# Patient Record
Sex: Male | Born: 1960 | Race: Black or African American | Hispanic: No | Marital: Single | Smoking: Current every day smoker
Health system: Southern US, Community
[De-identification: ages and names within clinical notes are randomized; demographics above are authoritative.]

## PROBLEM LIST (undated history)

## (undated) DIAGNOSIS — J439 Emphysema, unspecified: Secondary | ICD-10-CM

## (undated) DIAGNOSIS — T7840XA Allergy, unspecified, initial encounter: Secondary | ICD-10-CM

## (undated) DIAGNOSIS — I10 Essential (primary) hypertension: Secondary | ICD-10-CM

## (undated) DIAGNOSIS — E119 Type 2 diabetes mellitus without complications: Secondary | ICD-10-CM

## (undated) HISTORY — DX: Emphysema, unspecified: J43.9

## (undated) HISTORY — DX: Allergy, unspecified, initial encounter: T78.40XA

## (undated) HISTORY — DX: Type 2 diabetes mellitus without complications: E11.9

## (undated) HISTORY — PX: OTHER SURGICAL HISTORY: SHX169

## (undated) HISTORY — DX: Essential (primary) hypertension: I10

---

## 2007-04-01 ENCOUNTER — Emergency Department: Payer: Self-pay | Admitting: Emergency Medicine

## 2007-05-17 ENCOUNTER — Inpatient Hospital Stay: Payer: Self-pay | Admitting: Psychiatry

## 2008-12-20 ENCOUNTER — Emergency Department: Payer: Self-pay | Admitting: Emergency Medicine

## 2010-11-10 IMAGING — CT CT ABD-PELV W/ CM
1 of 2 series · 15 of 32 positions shown, 19 images · IV contrast (isovue)
Comparison: None

REASON FOR EXAM: (1) LLQ pain; (2) LLQ pain
COMMENTS:

PROCEDURE:     CT  - CT ABDOMEN / PELVIS  W  - December 20, 2008  [DATE]
RESULT:     History: Left lower quadrant pain
TECHNIQUE: Multiple axial images of the abdomen and pelvis were performed
from the lung bases to the pubic symphysis, with p.o. contrast and with 100
ml of Isovue 370 intravenous contrast.

[Series 2: appendicitis · axial · 0.77mm/px · z∈[-494,-66]mm · 15 of 157 slices shown, 19 images]
[im 7/157  soft-tissue]
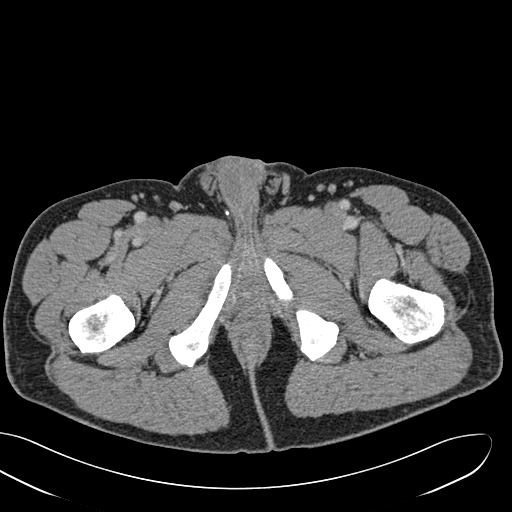
[im 7/157  bone]
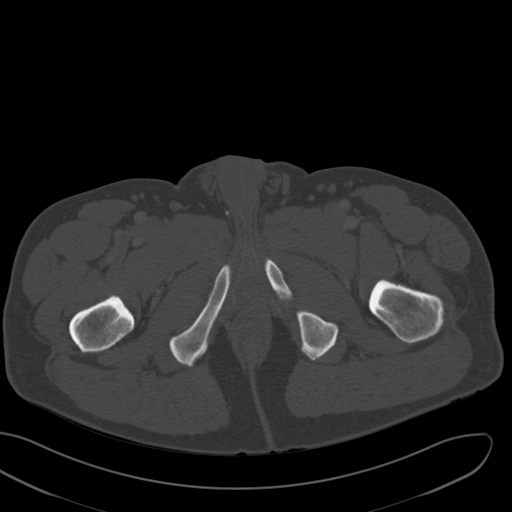
[im 20/157  soft-tissue]
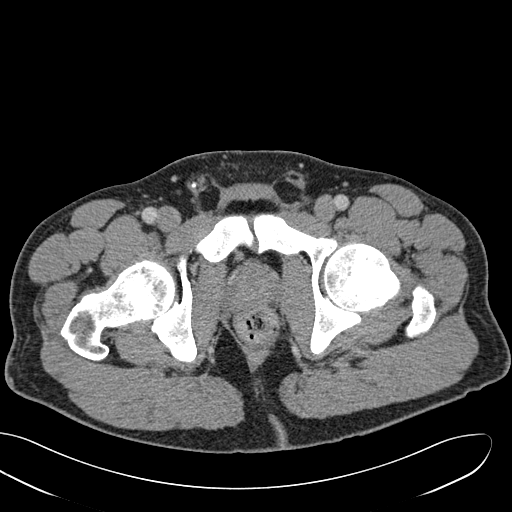
[im 33/157  soft-tissue]
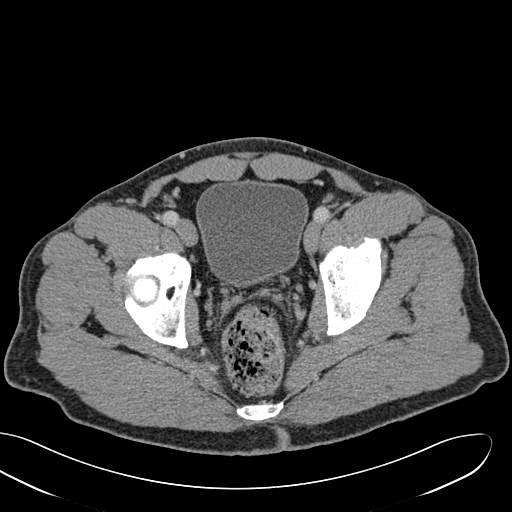
[im 46/157  soft-tissue]
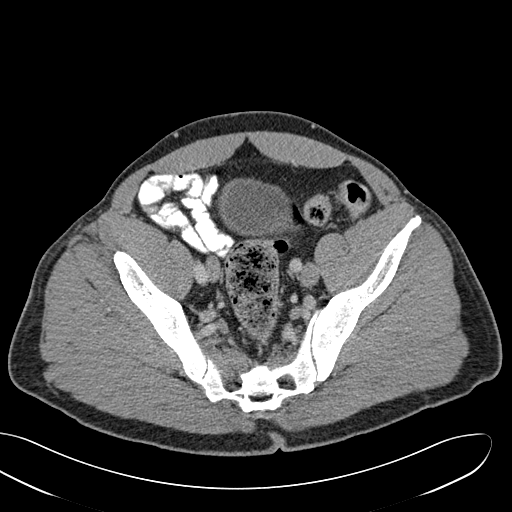
[im 53/157  soft-tissue]
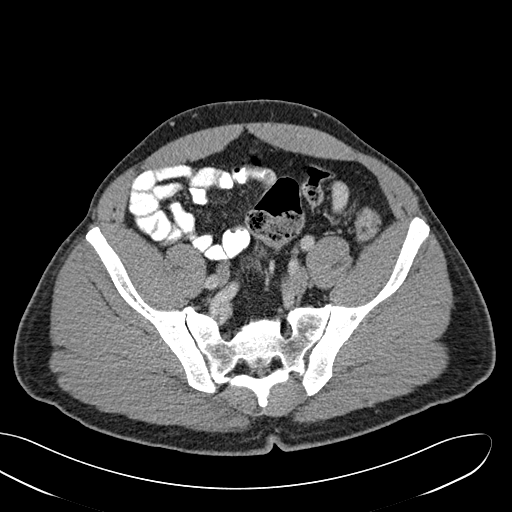
[im 66/157  soft-tissue]
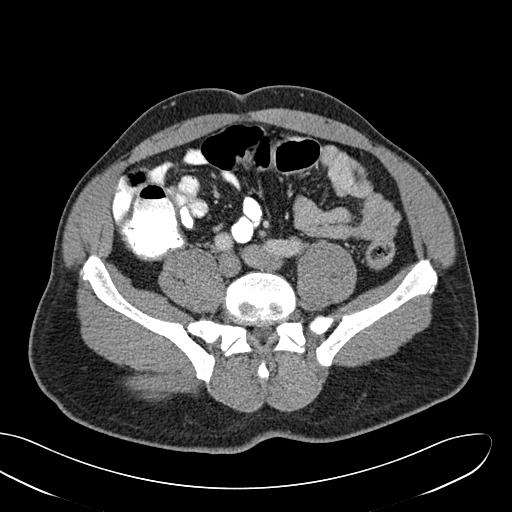
[im 79/157  soft-tissue]
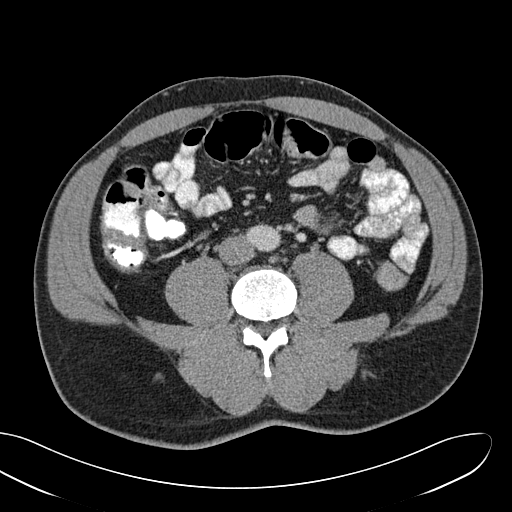
[im 92/157  soft-tissue]
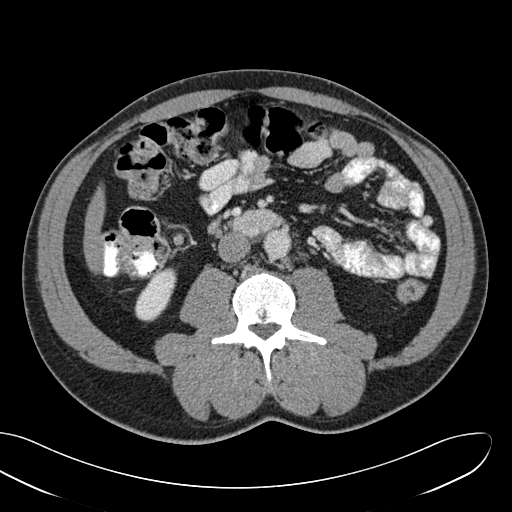
[im 105/157  soft-tissue]
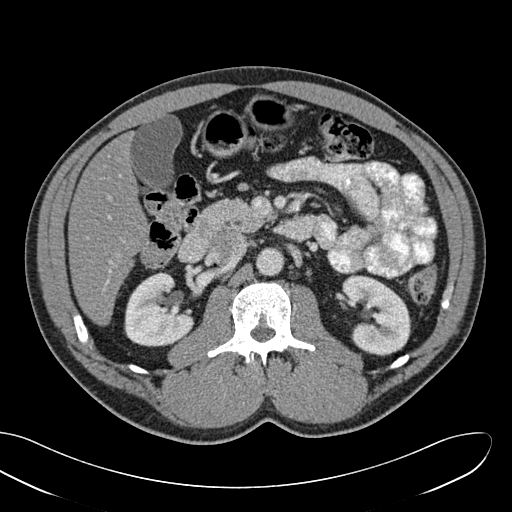
[im 105/157  bone]
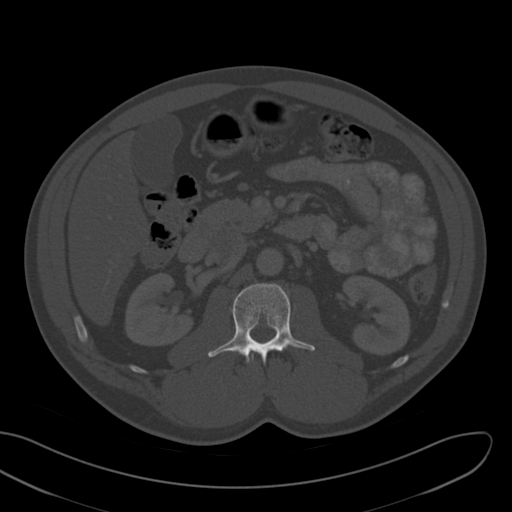
[im 111/157  soft-tissue]
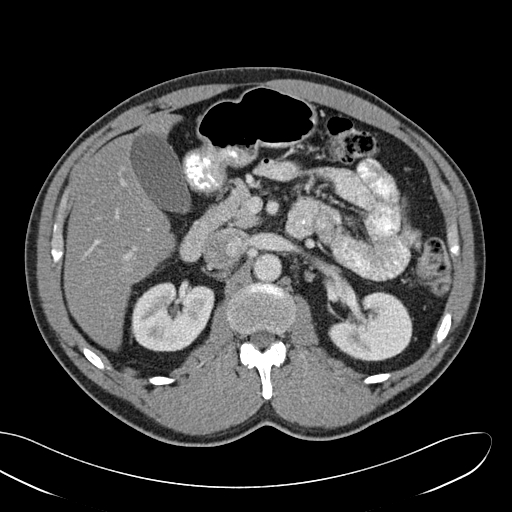
[im 124/157  soft-tissue]
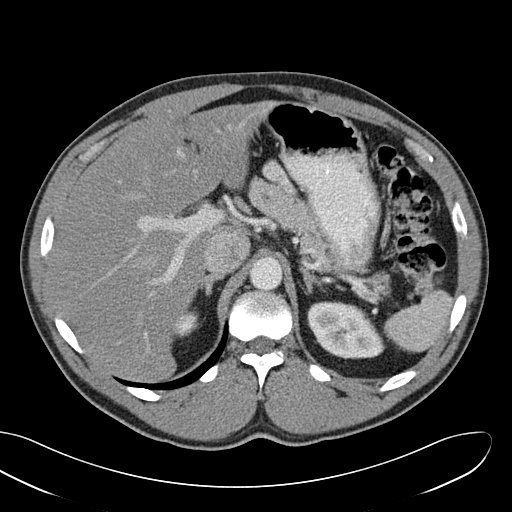
[im 131/157  lung]
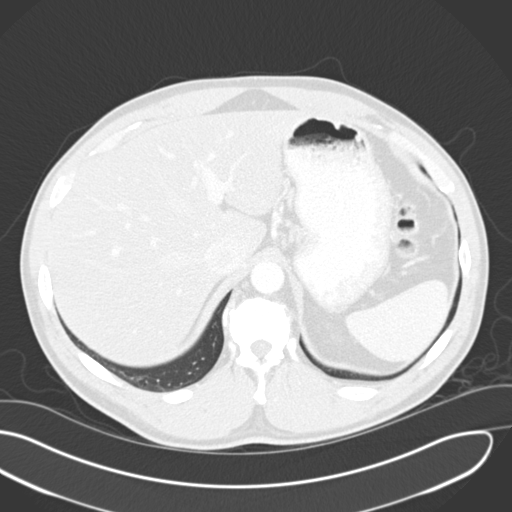
[im 137/157  soft-tissue]
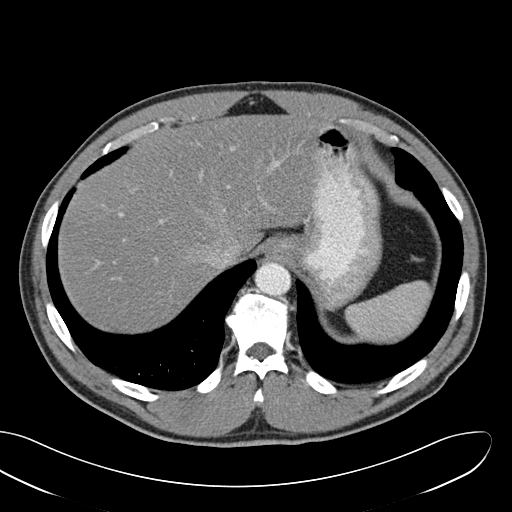
[im 137/157  lung]
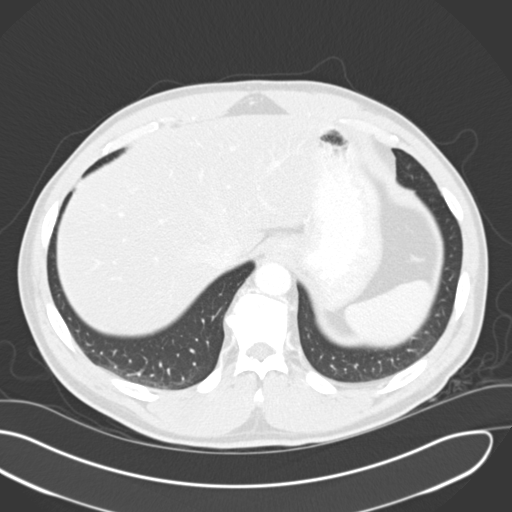
[im 144/157  lung]
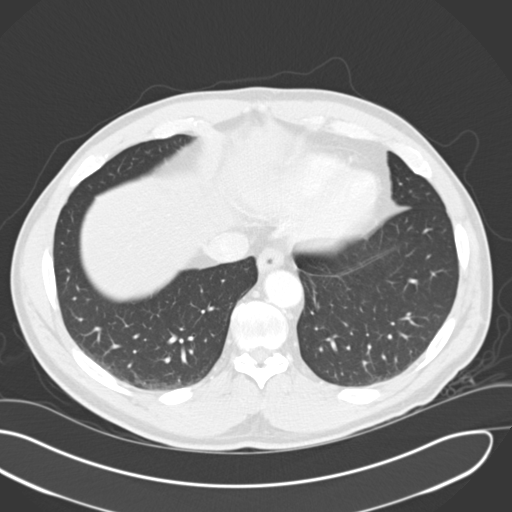
[im 150/157  soft-tissue]
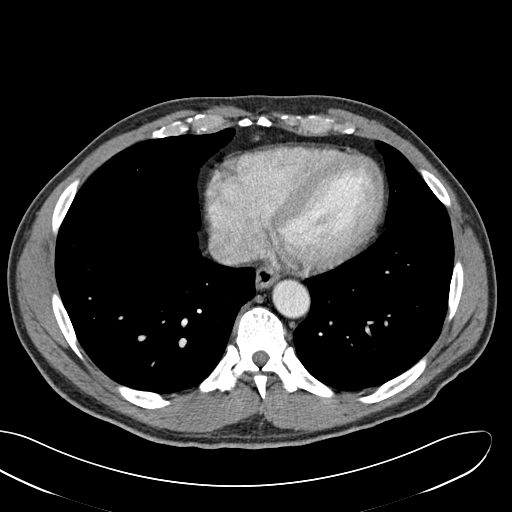
[im 150/157  lung]
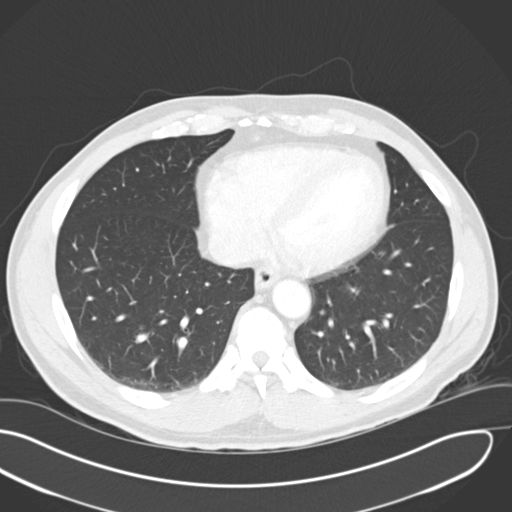

[15 of 32 positions shown; findings below may reference images not displayed]

FINDINGS: The lung bases are clear. There is no pneumothorax. The heart size is
normal.

The liver demonstrates no focal abnormality. There is no intrahepatic or
extrahepatic biliary ductal dilatation. There is cholelithiasis. The spleen
demonstrates no focal abnormality. The kidneys, adrenal glands, and pancreas
are normal. The bladder is unremarkable.

The stomach, duodenum, small intestine, and large intestine demonstrate no
contrast extravasation or dilatation. There is a normal caliber appendix in
the right lower quadrant without periappendiceal inflammatory changes. There
is no pneumoperitoneum, pneumatosis, or portal venous gas. There is no
abdominal or pelvic free fluid. There is no lymphadenopathy.

The abdominal aorta is normal in caliber .

The osseous structures are unremarkable.
IMPRESSION: The pancreas is unremarkable. There is no CT evidence of pancreatitis.

Normal appendix.

Cholelithiasis.

## 2011-01-17 ENCOUNTER — Emergency Department: Payer: Self-pay | Admitting: Internal Medicine

## 2012-12-07 IMAGING — CR DG CHEST 2V
1 series · 2 of 2 positions shown · non-contrast
Comparison: none

REASON FOR EXAM: cough
COMMENTS:

[Series 1: w chest pa · 0.14mm/px · 2 of 2 slices shown]
[im 1/2]
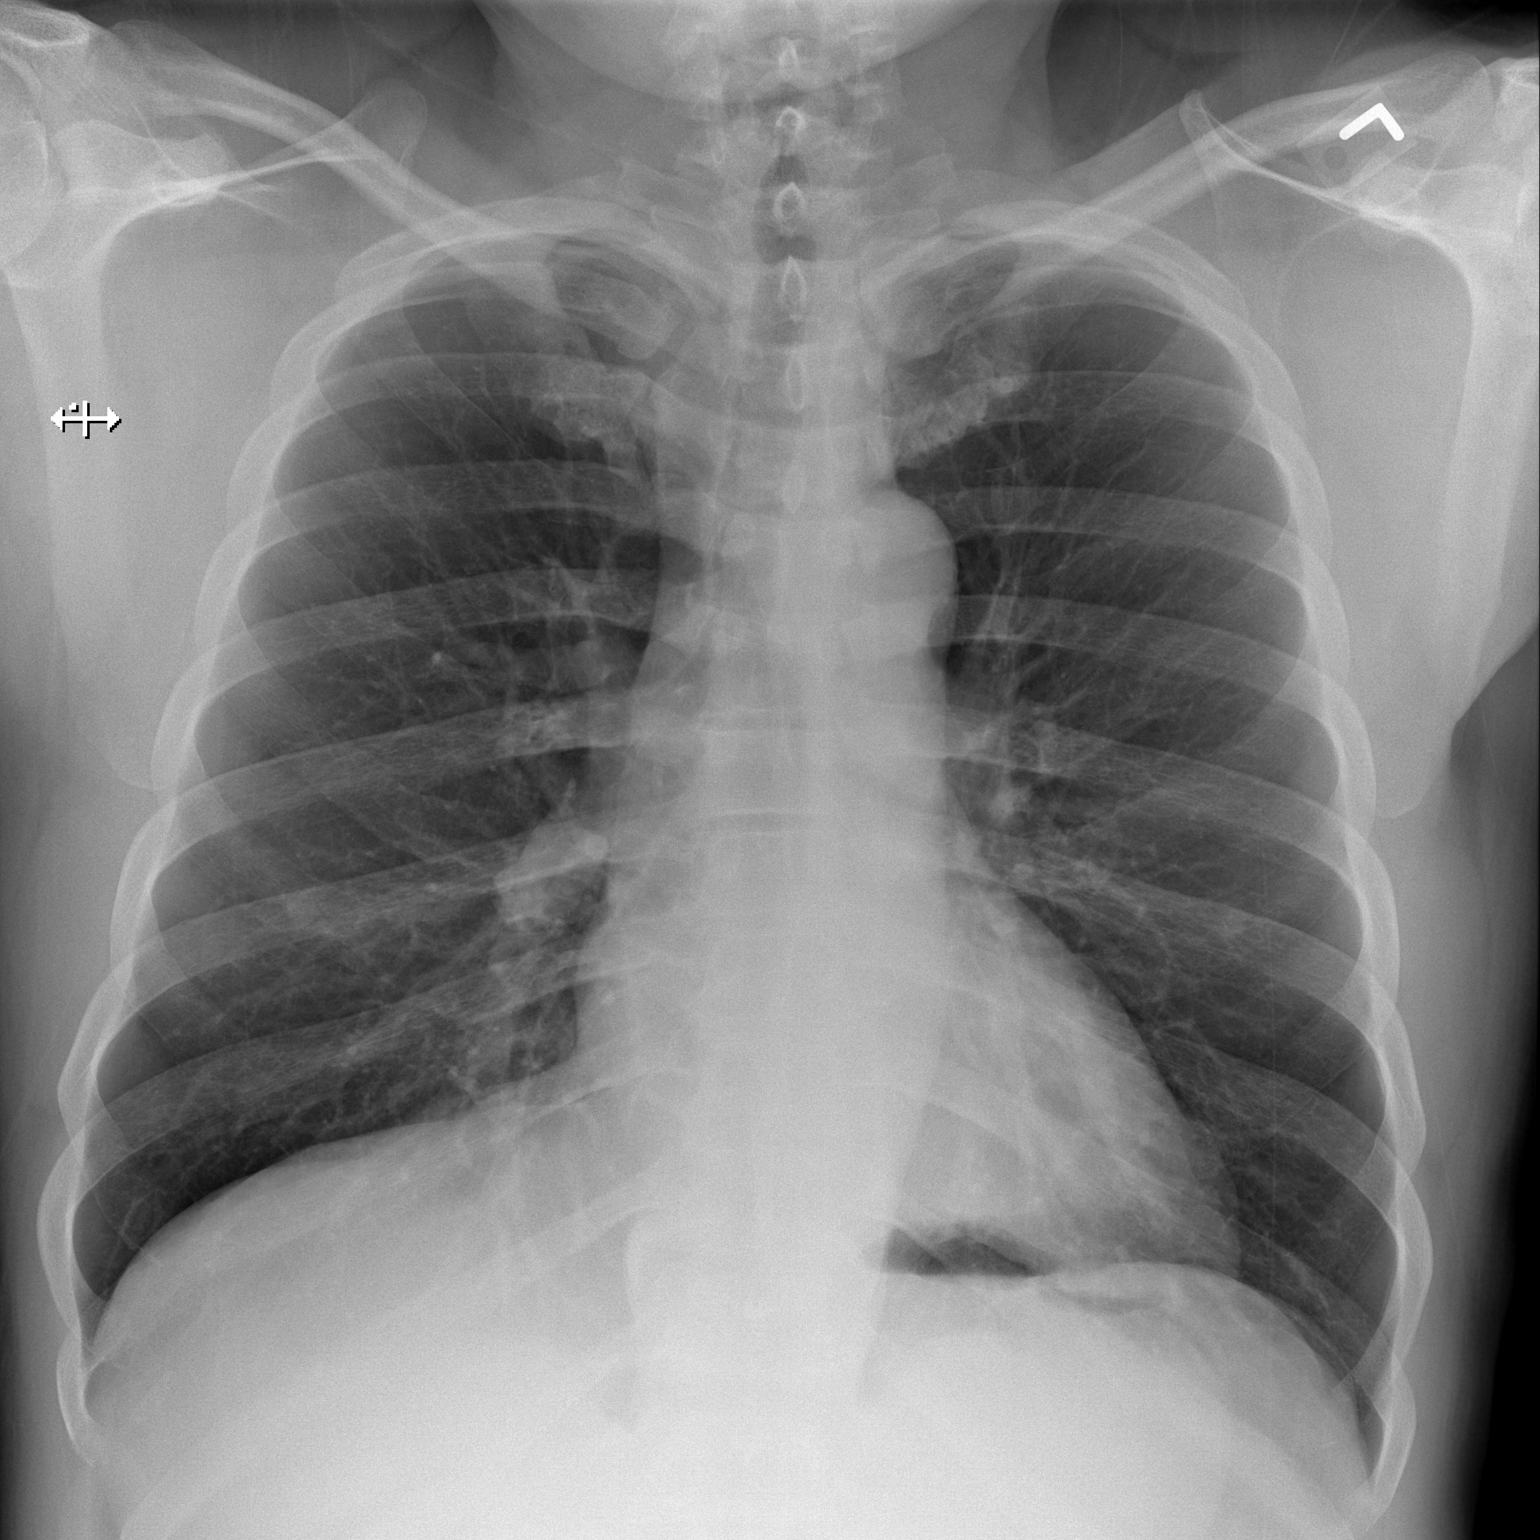
[im 2/2]
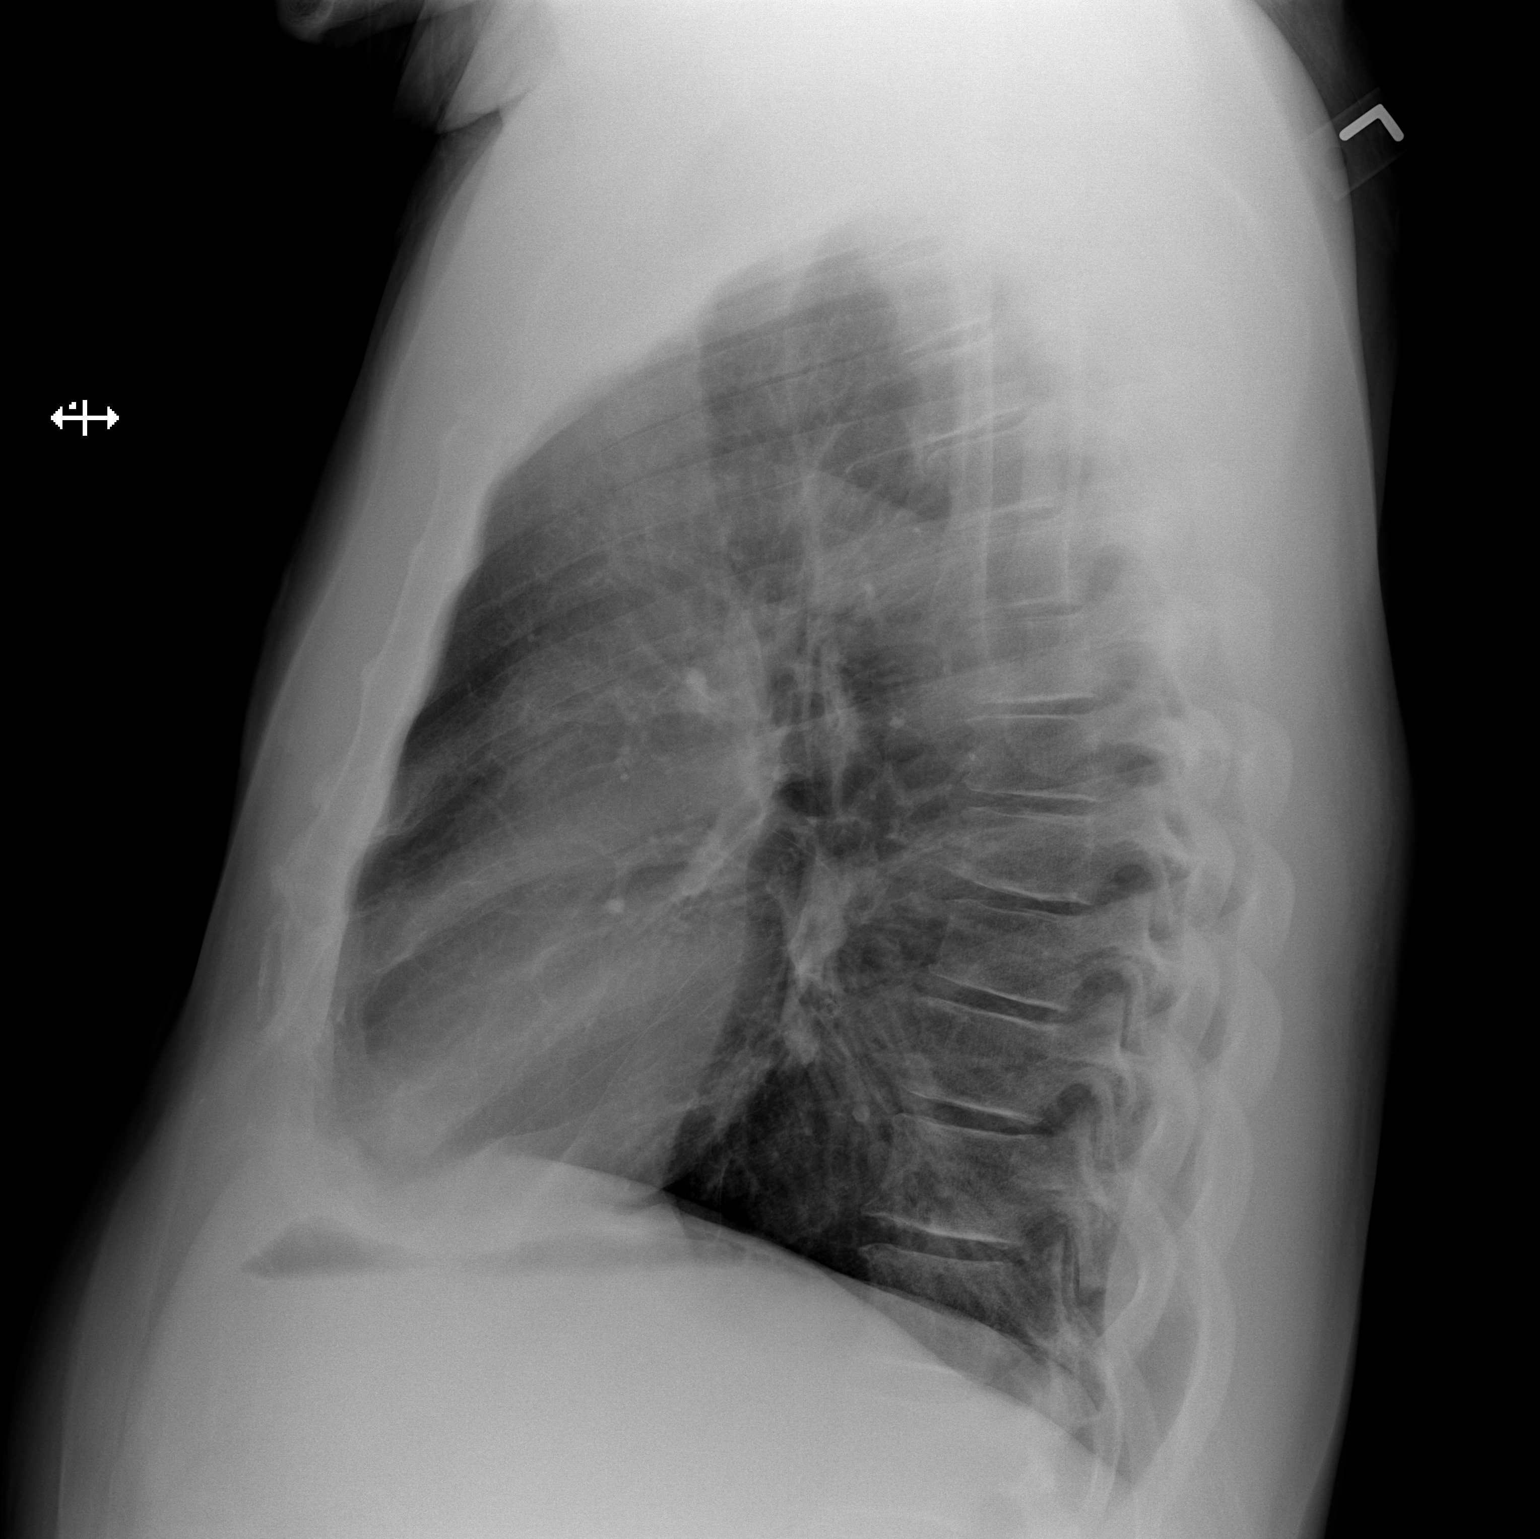

[2 of 2 positions shown; findings below may reference images not displayed]

PROCEDURE:     DXR - DXR CHEST PA (OR AP) AND LATERAL  - January 17, 2011  [DATE]

RESULT:     The lungs are mildly hyperinflated. There is no focal
infiltrate. The lung markings are coarse on the lateral the retrocardiac
region on the left. The cardiac silhouette is normal in size and the
pulmonary vascularity is not engorged.
IMPRESSION: Findings suggest atelectasis or developing infiltrate in
the left lower lobe. This is superimposed upon findings of COPD or reactive
airway disease. Followup films are recommended following therapy to assure
clearing.

## 2016-08-01 ENCOUNTER — Emergency Department
Admission: EM | Admit: 2016-08-01 | Discharge: 2016-08-01 | Disposition: A | Discharge status: Home / Self Care | Payer: Self-pay | Attending: Emergency Medicine | Admitting: Emergency Medicine

## 2016-08-01 ENCOUNTER — Encounter: Payer: Self-pay | Admitting: Emergency Medicine

## 2016-08-01 DIAGNOSIS — Z87891 Personal history of nicotine dependence: Secondary | ICD-10-CM | POA: Insufficient documentation

## 2016-08-01 DIAGNOSIS — R739 Hyperglycemia, unspecified: Secondary | ICD-10-CM | POA: Insufficient documentation

## 2016-08-01 LAB — GLUCOSE, CAPILLARY: Glucose-Capillary: 171 mg/dL — ABNORMAL HIGH (ref 65–99)

## 2016-08-01 NOTE — ED Notes (Signed)
EDP at bedside  

## 2016-08-01 NOTE — Discharge Instructions (Signed)
Please seek medical attention for any high fevers, chest pain, shortness of breath, change in behavior, persistent vomiting, bloody stool or any other new or concerning symptoms.  

## 2016-08-01 NOTE — ED Triage Notes (Signed)
Pt here with fiancee.  Pt has had tingling in feet for 8 months.  Fiance reports she looked up symptoms and checked sugar last night and was 681 so made him come to ED. No polyuria, or polydipsia.  Pt only c/o tingling in both feet.

## 2016-08-01 NOTE — ED Provider Notes (Signed)
[facility] Emergency Department Provider Note   ____________________________________________   I have reviewed the triage vital signs and the nursing notes.   HISTORY  Chief Complaint Tingling   History limited by: Not Limited   HPI Tyler Haney is a 56 y.o. male who presents to the emergency department today after having a glucose reading in the 600s last night. Apparently this was tested on someone else's glucometer. The patient does not have a history of diabetes. This test because for roughly the past half year the patient has been having some tingling. Additionally has had some weight loss but has not had any polyuria or polydipsia. He had eaten prior to the check. Patient denies any recent illness or fevers.   History reviewed. No pertinent past medical history.  There are no active problems to display for this patient.   History reviewed. No pertinent surgical history.  Prior to Admission medications   Not on File    Allergies Patient has no known allergies.  History reviewed. No pertinent family history.  Social History Social History  Substance Use Topics  . Smoking status: Former Games developermoker  . Smokeless tobacco: Never Used  . Alcohol use Yes    Review of Systems Constitutional: No fever/chills Eyes: No visual changes. ENT: No sore throat. Cardiovascular: Denies chest pain. Respiratory: Denies shortness of breath. Gastrointestinal: No abdominal pain.  No nausea, no vomiting.  No diarrhea.   Genitourinary: Negative for dysuria. Musculoskeletal: Negative for back pain. Skin: Negative for rash. Neurological: Positive for lower extremity tingling.  ____________________________________________   PHYSICAL EXAM:  VITAL SIGNS: ED Triage Vitals  Enc Vitals Group     BP 08/01/16 0849 (!) 137/94     Pulse Rate 08/01/16 0849 95     Resp 08/01/16 0849 18     Temp 08/01/16 0851 98.4 F (36.9 C)     Temp Source 08/01/16 0851 Oral      SpO2 08/01/16 0849 99 %     Weight 08/01/16 0848 190 lb (86.2 kg)     Height 08/01/16 0848 6\' 3"  (1.905 m)    Constitutional: Alert and oriented. Well appearing and in no distress. Eyes: Conjunctivae are normal.  ENT   Head: Normocephalic and atraumatic.   Nose: No congestion/rhinnorhea.   Mouth/Throat: Mucous membranes are moist.   Neck: No stridor. Hematological/Lymphatic/Immunilogical: No cervical lymphadenopathy. Cardiovascular: Normal rate, regular rhythm.  No murmurs, rubs, or gallops.  Respiratory: Normal respiratory effort without tachypnea nor retractions. Breath sounds are clear and equal bilaterally. No wheezes/rales/rhonchi. Gastrointestinal: Soft and non tender. No rebound. No guarding.  Genitourinary: Deferred Musculoskeletal: Normal range of motion in all extremities. No lower extremity edema. Neurologic:  Normal speech and language. No gross focal neurologic deficits are appreciated.  Skin:  Skin is warm, dry and intact. No rash noted. Psychiatric: Mood and affect are normal. Speech and behavior are normal. Patient exhibits appropriate insight and judgment.  ____________________________________________    LABS (pertinent positives/negatives)  Labs Reviewed  GLUCOSE, CAPILLARY - Abnormal; Notable for the following:       Result Value   Glucose-Capillary 171 (*)    All other components within normal limits     ____________________________________________   EKG  None  ____________________________________________    RADIOLOGY  None  ____________________________________________   PROCEDURES  Procedures  ____________________________________________   INITIAL IMPRESSION / ASSESSMENT AND PLAN / ED COURSE  Pertinent labs & imaging results that were available during my care of the patient were reviewed by me  and considered in my medical decision making (see chart for details).  Patient presented because of concerns for elevated  blood sugar that was read on someone else's glucometer last night. Today's blood sugar although high is not terribly so. Patient does not clinically appear to have any signs of diabetic ketoacidosis. He does have an appointment scheduled with primary care later this month. Did discuss importance of following up with primary care to get a true check of diabetes and further workup and screening labs.  ____________________________________________   FINAL CLINICAL IMPRESSION(S) / ED DIAGNOSES  Final diagnoses:  High blood sugar     Note: This dictation was prepared with Dragon dictation. Any transcriptional errors that result from this process are unintentional     Phineas Semen, MD 08/01/16 [id number]

## 2021-04-06 ENCOUNTER — Emergency Department: Payer: Self-pay

## 2021-04-06 ENCOUNTER — Other Ambulatory Visit: Payer: Self-pay

## 2021-04-06 ENCOUNTER — Encounter: Payer: Self-pay | Admitting: *Deleted

## 2021-04-06 ENCOUNTER — Emergency Department
Admission: EM | Admit: 2021-04-06 | Discharge: 2021-04-06 | Disposition: A | Discharge status: Home / Self Care | Payer: Self-pay | Attending: Emergency Medicine | Admitting: Emergency Medicine

## 2021-04-06 DIAGNOSIS — S82001A Unspecified fracture of right patella, initial encounter for closed fracture: Secondary | ICD-10-CM

## 2021-04-06 DIAGNOSIS — W1781XA Fall down embankment (hill), initial encounter: Secondary | ICD-10-CM | POA: Insufficient documentation

## 2021-04-06 DIAGNOSIS — S82031A Displaced transverse fracture of right patella, initial encounter for closed fracture: Secondary | ICD-10-CM | POA: Insufficient documentation

## 2021-04-06 MED ORDER — OXYCODONE-ACETAMINOPHEN 5-325 MG PO TABS
1.0000 | ORAL_TABLET | Freq: Once | ORAL | Status: AC
  Administered 2021-04-06: 1 via ORAL
  Filled 2021-04-06: qty 1

## 2021-04-06 MED ORDER — OXYCODONE-ACETAMINOPHEN 5-325 MG PO TABS: 1.0000 | ORAL_TABLET | ORAL | 0 refills | Status: AC | PRN

## 2021-04-06 NOTE — ED Provider Notes (Signed)
? ?[facility] ?Provider Note ? ? ? Event Date/Time  ? First MD Initiated Contact with Patient 04/06/21 1727   ?  (approximate) ? ? ?History  ? ?Knee Pain ? ? ?HPI ? ?Tyler Haney is a 61 y.o. male presents emergency department stating that today at work he was walking down a hill when his knee gave out and he fell directly on the concrete.  States he was unable to get up without help, cannot extend the leg, and could not walk on the leg.  No numbness or tingling. ? ?  ? ? ?Physical Exam  ? ?Triage Vital Signs: ?ED Triage Vitals [04/06/21 1651]  ?Enc Vitals Group  ?   BP 129/87  ?   Pulse Rate 96  ?   Resp 20  ?   Temp 98.4 ?F (36.9 ?C)  ?   Temp Source Oral  ?   SpO2 96 %  ?   Weight 189 lb (85.7 kg)  ?   Height 6\' 3"  (1.905 m)  ?   Head Circumference   ?   Peak Flow   ?   Pain Score 8  ?   Pain Loc   ?   Pain Edu?   ?   Excl. in [place]?   ? ? ?Most recent vital signs: ?Vitals:  ? 04/06/21 1651 04/06/21 1859  ?BP: 129/87 128/70  ?Pulse: 96 88  ?Resp: 20 18  ?Temp: 98.4 ?F (36.9 ?C)   ?SpO2: 96% 99%  ? ? ? ?General: Awake, no distress.   ?CV:  Good peripheral perfusion. regular rate and  rhythm ?Resp:  Normal effort.  ?Abd:  No distention.   ?Other:  Right knee has positive deformity, decreased range of motion, patient cannot lift leg, n/v intact ? ? ?ED Results / Procedures / Treatments  ? ?Labs ?(all labs ordered are listed, but only abnormal results are displayed) ?Labs Reviewed - No data to display ? ? ?EKG ? ? ? ? ?RADIOLOGY ?X-ray of the right knee ?CT of the right knee ? ? ? ?PROCEDURES: ? ? ?Procedures ? ? ?MEDICATIONS ORDERED IN ED: ?Medications  ?oxyCODONE-acetaminophen (PERCOCET/ROXICET) 5-325 MG per tablet 1 tablet (1 tablet Oral Given 04/06/21 1820)  ? ? ? ?IMPRESSION / MDM / ASSESSMENT AND PLAN / ED COURSE  ?I reviewed the triage vital signs and the nursing notes. ?             ?               ? ?Differential diagnosis includes, but is not limited to, fracture of the right knee,  dislocation of the right knee, quadricep tear ? ?X-ray of the right knee was independently reviewed by me, has a mid patella horizontal fracture, large amount of soft tissue swelling. ? ?Consult to orthopedics.  Dr. Sharlet Salina states to get a CT of the knee, placed in a knee immobilizer, and he will follow-up with patient in the office tomorrow.. ? ?Patient was given Percocet for pain.  Ice pack was applied.  Nursing staff instructed to apply knee immobilizer and give crutches. ? ?Awaiting CT ? ?CT of the right knee reviewed by me independently.  Radiologist read as patella fracture with blood products and soft tissue swelling.  Some part of it is comminuted. ? ?Explained all findings to the patient.  They are to follow-up with [facility] tomorrow in clinic per Dr. Sharlet Salina.  He is to call in the morning for an appointment.  A prescription for Percocet was sent to [facility] on [address].  He is to elevate and ice.  Avoid bearing weight.  Return overnight if worsening. ? ? ? ? ?  ? ? ?FINAL CLINICAL IMPRESSION(S) / ED DIAGNOSES  ? ?Final diagnoses:  ?Closed displaced fracture of right patella, unspecified fracture morphology, initial encounter  ? ? ? ?Rx / DC Orders  ? ?ED Discharge Orders   ? ?      Ordered  ?  oxyCODONE-acetaminophen (PERCOCET) 5-325 MG tablet  Every 4 hours PRN       ? 04/06/21 1856  ? ?  ?  ? ?  ? ? ? ?Note:  This document was prepared using Dragon voice recognition software and may include unintentional dictation errors. ? ?  ?Versie Starks, PA-C ?04/06/21 1900 ? ?  ?Harvest Dark, MD ?04/06/21 2336 ? ?

## 2021-04-06 NOTE — Discharge Instructions (Signed)
Follow-up with [facility].  Please call them in the morning if they want to see you in the clinic tomorrow.  Keep ice on the right knee.  Do not bear weight if possible.  Use your crutches.  Return emergency department worsening ? ?

## 2021-04-06 NOTE — ED Notes (Signed)
Knee immobilizer placed on affected knee.  Pt tolerated well.  Pt and wife instructed on how to use immobilizer.  Crutches given, sized appropriately and pt instructed how to use.  Ice pack given ?

## 2021-04-06 NOTE — ED Triage Notes (Signed)
First Nurse Note: Pt fell on concrete at work. His knee gave out. Right knee is swollen and bruised. He is unable to bend knee. Had to help him out of the car ?

## 2021-04-06 NOTE — ED Triage Notes (Signed)
Pt states his knee gave out at work and pt fell onto concrete.  Pt has right knee pain and swelling  pt unable to ambulate.  Pt states WC.  Pt also has abrasion to right knee and right hand   pt alert .  ?

## 2022-04-29 ENCOUNTER — Inpatient Hospital Stay: Payer: Medicaid Other | Attending: Internal Medicine | Admitting: Internal Medicine

## 2022-04-29 ENCOUNTER — Inpatient Hospital Stay: Payer: Medicaid Other

## 2022-04-29 ENCOUNTER — Encounter: Payer: Self-pay | Admitting: Internal Medicine

## 2022-04-29 VITALS — BP 114/86 | HR 88 | Temp 97.8°F | Resp 16 | Ht 75.0 in | Wt 184.0 lb

## 2022-04-29 DIAGNOSIS — K760 Fatty (change of) liver, not elsewhere classified: Secondary | ICD-10-CM | POA: Diagnosis not present

## 2022-04-29 DIAGNOSIS — F1010 Alcohol abuse, uncomplicated: Secondary | ICD-10-CM | POA: Diagnosis not present

## 2022-04-29 DIAGNOSIS — D696 Thrombocytopenia, unspecified: Secondary | ICD-10-CM | POA: Insufficient documentation

## 2022-04-29 DIAGNOSIS — F17210 Nicotine dependence, cigarettes, uncomplicated: Secondary | ICD-10-CM | POA: Diagnosis not present

## 2022-04-29 DIAGNOSIS — R7989 Other specified abnormal findings of blood chemistry: Secondary | ICD-10-CM | POA: Diagnosis present

## 2022-04-29 DIAGNOSIS — D7589 Other specified diseases of blood and blood-forming organs: Secondary | ICD-10-CM | POA: Insufficient documentation

## 2022-04-29 LAB — CBC WITH DIFFERENTIAL/PLATELET
Abs Immature Granulocytes: 0 10*3/uL (ref 0.00–0.07)
Basophils Absolute: 0.1 10*3/uL (ref 0.0–0.1)
Basophils Relative: 1 %
Eosinophils Absolute: 0.2 10*3/uL (ref 0.0–0.5)
Eosinophils Relative: 5 %
HCT: 39 % (ref 39.0–52.0)
Hemoglobin: 13.7 g/dL (ref 13.0–17.0)
Immature Granulocytes: 0 %
Lymphocytes Relative: 36 %
Lymphs Abs: 1.8 10*3/uL (ref 0.7–4.0)
MCH: 36.4 pg — ABNORMAL HIGH (ref 26.0–34.0)
MCHC: 35.1 g/dL (ref 30.0–36.0)
MCV: 103.7 fL — ABNORMAL HIGH (ref 80.0–100.0)
Monocytes Absolute: 0.6 10*3/uL (ref 0.1–1.0)
Monocytes Relative: 11 %
Neutro Abs: 2.4 10*3/uL (ref 1.7–7.7)
Neutrophils Relative %: 47 %
Platelets: 124 10*3/uL — ABNORMAL LOW (ref 150–400)
RBC: 3.76 MIL/uL — ABNORMAL LOW (ref 4.22–5.81)
RDW: 13.8 % (ref 11.5–15.5)
WBC: 5.1 10*3/uL (ref 4.0–10.5)
nRBC: 0 % (ref 0.0–0.2)

## 2022-04-29 LAB — COMPREHENSIVE METABOLIC PANEL
ALT: 58 U/L — ABNORMAL HIGH (ref 0–44)
AST: 120 U/L — ABNORMAL HIGH (ref 15–41)
Albumin: 4.5 g/dL (ref 3.5–5.0)
Alkaline Phosphatase: 113 U/L (ref 38–126)
Anion gap: 11 (ref 5–15)
BUN: 9 mg/dL (ref 8–23)
CO2: 26 mmol/L (ref 22–32)
Calcium: 9.2 mg/dL (ref 8.9–10.3)
Chloride: 97 mmol/L — ABNORMAL LOW (ref 98–111)
Creatinine, Ser: 0.92 mg/dL (ref 0.61–1.24)
GFR, Estimated: >60 mL/min (ref >60)
Glucose, Bld: 101 mg/dL — ABNORMAL HIGH (ref 70–99)
Potassium: 4.4 mmol/L (ref 3.5–5.1)
Sodium: 134 mmol/L — ABNORMAL LOW (ref 135–145)
Total Bilirubin: 0.9 mg/dL (ref 0.3–1.2)
Total Protein: 8.1 g/dL (ref 6.5–8.1)

## 2022-04-29 LAB — FOLATE: Folate: 21.3 ng/mL (ref >5.9)

## 2022-04-29 LAB — FERRITIN: Ferritin: 800 ng/mL — ABNORMAL HIGH (ref 24–336)

## 2022-04-29 LAB — IRON AND TIBC
Iron: 125 ug/dL (ref 45–182)
Saturation Ratios: 39 % (ref 17.9–39.5)
TIBC: 325 ug/dL (ref 250–450)
UIBC: 200 ug/dL

## 2022-04-29 LAB — VITAMIN B12: Vitamin B-12: 462 pg/mL (ref 180–914)

## 2022-04-29 LAB — LACTATE DEHYDROGENASE: LDH: 141 U/L (ref 98–192)

## 2022-04-29 NOTE — Progress Notes (Signed)
Pt referred by Zella Ball wood for elevated ferritin level.

## 2022-04-29 NOTE — Progress Notes (Signed)
[facility] CONSULT NOTE  Patient Care Team: Center, [facility] as PCP - General  CHIEF COMPLAINTS/PURPOSE OF CONSULTATION: Hereditary hemochromatosis/Elevated Ferritn   HEMATOLOGY HISTORY  #  MARCH [id number]  Iron sat: 79%; MCV 103- Hb 13  HISTORY OF PRESENTING ILLNESS:  Tyler Haney 62 y.o.  male has been referred to [place] for further evaluation/work-up/ recommendations for elevated ferritin.  As part routine labs/work-up patient was noted to have elevated ferritin; elevated MCV.  Patient otherwise denies any swelling of the legs.  Denies any nausea vomiting abdominal pain.   Personal Hx of liver disease: none Hepatitis B/C: None Alcohol: beer  Family Hx of Liver disease or cancer: none  Review of Systems  Constitutional:  Negative for chills, diaphoresis, fever, malaise/fatigue and weight loss.  HENT:  Negative for nosebleeds and sore throat.   Eyes:  Negative for double vision.  Respiratory:  Negative for cough, hemoptysis, sputum production, shortness of breath and wheezing.   Cardiovascular:  Negative for chest pain, palpitations, orthopnea and leg swelling.  Gastrointestinal:  Negative for abdominal pain, blood in stool, constipation, diarrhea, heartburn, melena, nausea and vomiting.  Genitourinary:  Negative for dysuria, frequency and urgency.  Musculoskeletal:  Negative for back pain and joint pain.  Skin: Negative.  Negative for itching and rash.  Neurological:  Negative for dizziness, tingling, focal weakness, weakness and headaches.  Endo/Heme/Allergies:  Does not bruise/bleed easily.  Psychiatric/Behavioral:  Negative for depression. The patient is not nervous/anxious and does not have insomnia.     MEDICAL HISTORY:  Past Medical History:  Diagnosis Date   Allergy    Diabetes mellitus without complication    Emphysema lung    Hypertension     SURGICAL HISTORY: Past Surgical History:  Procedure Laterality Date    right knee surgery      SOCIAL HISTORY: Social History   Socioeconomic History   Marital status: Single    Spouse name: Not on file   Number of children: Not on file   Years of education: Not on file   Highest education level: Not on file  Occupational History   Not on file  Tobacco Use   Smoking status: Every Day    Types: Cigarettes   Smokeless tobacco: Never  Substance and Sexual Activity   Alcohol use: Not Currently   Drug use: No   Sexual activity: Yes  Other Topics Concern   Not on file  Social History Narrative   Not on file   Social Determinants of Health   Financial Resource Strain: Not on file  Food Insecurity: No Food Insecurity (04/29/2022)   Hunger Vital Sign    Worried About Running Out of Food in the Last Year: Never true    Ran Out of Food in the Last Year: Never true  Transportation Needs: No Transportation Needs (04/29/2022)   PRAPARE - Administrator, Civil Service (Medical): No    Lack of Transportation (Non-Medical): No  Physical Activity: Not on file  Stress: Not on file  Social Connections: Not on file  Intimate Partner Violence: Not At Risk (04/29/2022)   Humiliation, Afraid, Rape, and Kick questionnaire    Fear of Current or Ex-Partner: No    Emotionally Abused: No    Physically Abused: No    Sexually Abused: No    FAMILY HISTORY: History reviewed. No pertinent family history.  ALLERGIES:  has No Known Allergies.  MEDICATIONS:  Current Outpatient Medications  Medication Sig Dispense Refill  albuterol (VENTOLIN HFA) 108 (90 Base) MCG/ACT inhaler Inhale into the lungs.     aspirin 81 MG chewable tablet Chew by mouth.     atorvastatin (LIPITOR) 80 MG tablet Take 80 mg by mouth daily.     budesonide-formoterol (SYMBICORT) 160-4.5 MCG/ACT inhaler Inhale into the lungs.     CIALIS 10 MG tablet Take by mouth.     lisinopril (ZESTRIL) 2.5 MG tablet Take 2.5 mg by mouth daily.     metFORMIN (GLUCOPHAGE) 1000 MG tablet Take 1,000 mg  by mouth 2 (two) times daily.     No current facility-administered medications for this visit.      PHYSICAL EXAMINATION:   Vitals:   04/29/22 1139  BP: 114/86  Pulse: 88  Resp: 16  Temp: 97.8 F (36.6 C)  SpO2: 100%   Filed Weights   04/29/22 1139  Weight: 184 lb (83.5 kg)    Physical Exam Vitals and nursing note reviewed.  HENT:     Head: Normocephalic and atraumatic.     Mouth/Throat:     Pharynx: Oropharynx is clear.  Eyes:     Extraocular Movements: Extraocular movements intact.     Pupils: Pupils are equal, round, and reactive to light.  Cardiovascular:     Rate and Rhythm: Normal rate and regular rhythm.  Pulmonary:     Comments: Decreased breath sounds bilaterally.  Abdominal:     Palpations: Abdomen is soft.  Musculoskeletal:        General: Normal range of motion.     Cervical back: Normal range of motion.  Skin:    General: Skin is warm.  Neurological:     General: No focal deficit present.     Mental Status: He is alert and oriented to person, place, and time.  Psychiatric:        Behavior: Behavior normal.        Judgment: Judgment normal.     LABORATORY DATA:  I have reviewed the data as listed Lab Results  Component Value Date   WBC 5.1 04/29/2022   HGB 13.7 04/29/2022   HCT 39.0 04/29/2022   MCV 103.7 (H) 04/29/2022   PLT 124 (L) 04/29/2022   No results for input(s): "NA", "K", "CL", "CO2", "GLUCOSE", "BUN", "CREATININE", "CALCIUM", "GFRNONAA", "GFRAA", "PROT", "ALBUMIN", "AST", "ALT", "ALKPHOS", "BILITOT", "BILIDIR", "IBILI" in the last 8760 hours.   No results found.  Elevated ferritin #ELEVATED FERRITIN: [Iron saturation 17% ferritin 848]-discussed the multiple etiologies including hereditary hemochromatosis.  Other potential causes include: inflammation/liver disease/metabolic syndrome/alcohol/beer.  # Mild macrocytosis-MCV 103; no significant anemia.  Suspect liver disease/alcoholic fatty liver.  Check B12 folic acid  chemistries CBC.  Ultrasound liver.  # smoker: Discussed with the patient regarding the ill effects of smoking- including but not limited to cardiac lung and vascular diseases and malignancies. Counseled against smoking; patient-not interested in quitting.   Thank you for allowing me to participate in the care of your pleasant patient. Please do not hesitate to contact me with questions or concerns in the interim.   # DISPOSITION:  # labs today- cbc/cmp; iron studies; ferritin; genotyping. B12;folic acid # follow up in 2-3 weeks- MD; NO labs; [place] abodmen prior- Dr.B  All questions were answered. The patient knows to call the clinic with any problems, questions or concerns.    Earna CoderGovinda R Bertil Brickey, MD 04/29/2022 12:17 PM

## 2022-04-29 NOTE — Assessment & Plan Note (Addendum)
#  ELEVATED FERRITIN: [Iron saturation 17% ferritin 848]-discussed the multiple etiologies including hereditary hemochromatosis.  Other potential causes include: inflammation/liver disease/metabolic syndrome/alcohol/beer.  # Mild macrocytosis-MCV 103; no significant anemia.  Suspect liver disease/alcoholic fatty liver.  Check B12 folic acid chemistries CBC.  Ultrasound liver.  # smoker: Discussed with the patient regarding the ill effects of smoking- including but not limited to cardiac lung and vascular diseases and malignancies. Counseled against smoking; patient-not interested in quitting.   Thank you for allowing me to participate in the care of your pleasant patient. Please do not hesitate to contact me with questions or concerns in the interim.   # DISPOSITION:  # labs today- cbc/cmp; iron studies; ferritin; genotyping. B12;folic acid # follow up in 2-3 weeks- MD; NO labs; [place] abodmen prior- Dr.B

## 2022-05-16 ENCOUNTER — Ambulatory Visit
Admission: RE | Admit: 2022-05-16 | Discharge: 2022-05-16 | Disposition: A | Discharge status: Home / Self Care | Payer: Medicaid Other | Source: Ambulatory Visit | Attending: Internal Medicine | Admitting: Internal Medicine

## 2022-05-16 DIAGNOSIS — K760 Fatty (change of) liver, not elsewhere classified: Secondary | ICD-10-CM | POA: Diagnosis not present

## 2022-05-16 LAB — HEMOCHROMATOSIS DNA-PCR(C282Y,H63D)

## 2022-05-23 ENCOUNTER — Inpatient Hospital Stay (HOSPITAL_BASED_OUTPATIENT_CLINIC_OR_DEPARTMENT_OTHER): Payer: Medicaid Other | Admitting: Internal Medicine

## 2022-05-23 ENCOUNTER — Encounter: Payer: Self-pay | Admitting: Internal Medicine

## 2022-05-23 VITALS — BP 120/86 | HR 109 | Temp 97.8°F | Ht 75.0 in | Wt 181.6 lb

## 2022-05-23 DIAGNOSIS — R7989 Other specified abnormal findings of blood chemistry: Secondary | ICD-10-CM | POA: Diagnosis not present

## 2022-05-23 NOTE — Progress Notes (Signed)
No concerns today 

## 2022-05-23 NOTE — Assessment & Plan Note (Addendum)
#  ELEVATED FERRITIN: [Iron saturation 17% ferritin 848]-likely secondary to   inflammation/liver disease/metabolic syndrome/alcohol/beer.  April 2024 ultrasound liver- hepatic fatty infiltration.  Cholelithiasis. hereditary hemochromatosis- NEGATIVE;  # Mild thrombocytopenia 120s likely secondary to alcohol/liver disease.  # smoker: Discussed with the patient regarding the ill effects of smoking- including but not limited to cardiac lung and vascular diseases and malignancies. Counseled against smoking; patient-.  # Alcohol abuse-recommend moderation and then finally coming of alcohol.  Discussed risk of cirrhosis and other malignancies.  # DISPOSITION:  # follow up as needed- Dr.B

## 2022-05-23 NOTE — Progress Notes (Signed)
[facility] CONSULT NOTE  Patient Care Team: Center, [facility] as PCP - General  CHIEF COMPLAINTS/PURPOSE OF CONSULTATION: Hereditary hemochromatosis/Elevated Ferritn   HEMATOLOGY HISTORY  #  MARCH [id number]  Iron sat: 79%; MCV 103- Hb 13  HISTORY OF PRESENTING ILLNESS:  Tyler Haney 62 y.o.  male is here to review results of his blood work/ultrasound ordered for elevated ferritin...  Unfortunately patient continues to drink alcohol.  Continues to smoke.  Patient otherwise denies any swelling of the legs.  Denies any nausea vomiting abdominal pain.    Review of Systems  Constitutional:  Negative for chills, diaphoresis, fever, malaise/fatigue and weight loss.  HENT:  Negative for nosebleeds and sore throat.   Eyes:  Negative for double vision.  Respiratory:  Negative for cough, hemoptysis, sputum production, shortness of breath and wheezing.   Cardiovascular:  Negative for chest pain, palpitations, orthopnea and leg swelling.  Gastrointestinal:  Negative for abdominal pain, blood in stool, constipation, diarrhea, heartburn, melena, nausea and vomiting.  Genitourinary:  Negative for dysuria, frequency and urgency.  Musculoskeletal:  Positive for back pain.  Skin: Negative.  Negative for itching and rash.  Neurological:  Negative for dizziness, tingling, focal weakness, weakness and headaches.  Endo/Heme/Allergies:  Does not bruise/bleed easily.  Psychiatric/Behavioral:  Negative for depression. The patient is not nervous/anxious and does not have insomnia.     MEDICAL HISTORY:  Past Medical History:  Diagnosis Date   Allergy    Diabetes mellitus without complication (HCC)    Emphysema lung (HCC)    Hypertension     SURGICAL HISTORY: Past Surgical History:  Procedure Laterality Date   right knee surgery      SOCIAL HISTORY: Social History   Socioeconomic History   Marital status: Single    Spouse name: Not on file    Number of children: Not on file   Years of education: Not on file   Highest education level: Not on file  Occupational History   Not on file  Tobacco Use   Smoking status: Every Day    Types: Cigarettes   Smokeless tobacco: Never  Substance and Sexual Activity   Alcohol use: Not Currently   Drug use: No   Sexual activity: Yes  Other Topics Concern   Not on file  Social History Narrative   Not on file   Social Determinants of Health   Financial Resource Strain: Not on file  Food Insecurity: No Food Insecurity (04/29/2022)   Hunger Vital Sign    Worried About Running Out of Food in the Last Year: Never true    Ran Out of Food in the Last Year: Never true  Transportation Needs: No Transportation Needs (04/29/2022)   PRAPARE - Administrator, Civil Service (Medical): No    Lack of Transportation (Non-Medical): No  Physical Activity: Not on file  Stress: Not on file  Social Connections: Not on file  Intimate Partner Violence: Not At Risk (04/29/2022)   Humiliation, Afraid, Rape, and Kick questionnaire    Fear of Current or Ex-Partner: No    Emotionally Abused: No    Physically Abused: No    Sexually Abused: No    FAMILY HISTORY: History reviewed. No pertinent family history.  ALLERGIES:  has No Known Allergies.  MEDICATIONS:  Current Outpatient Medications  Medication Sig Dispense Refill   albuterol (VENTOLIN HFA) 108 (90 Base) MCG/ACT inhaler Inhale into the lungs.     aspirin 81 MG chewable tablet Chew  by mouth.     atorvastatin (LIPITOR) 80 MG tablet Take 80 mg by mouth daily.     budesonide-formoterol (SYMBICORT) 160-4.5 MCG/ACT inhaler Inhale into the lungs.     CIALIS 10 MG tablet Take by mouth.     lisinopril (ZESTRIL) 2.5 MG tablet Take 2.5 mg by mouth daily.     metFORMIN (GLUCOPHAGE) 1000 MG tablet Take 1,000 mg by mouth 2 (two) times daily.     No current facility-administered medications for this visit.      PHYSICAL EXAMINATION:   Vitals:    05/23/22 0915  BP: 120/86  Pulse: (!) 109  Temp: 97.8 F (36.6 C)  SpO2: 98%   Filed Weights   05/23/22 0915  Weight: 181 lb 9.6 oz (82.4 kg)    Physical Exam Vitals and nursing note reviewed.  HENT:     Head: Normocephalic and atraumatic.     Mouth/Throat:     Pharynx: Oropharynx is clear.  Eyes:     Extraocular Movements: Extraocular movements intact.     Pupils: Pupils are equal, round, and reactive to light.  Cardiovascular:     Rate and Rhythm: Normal rate and regular rhythm.  Pulmonary:     Comments: Decreased breath sounds bilaterally.  Abdominal:     Palpations: Abdomen is soft.  Musculoskeletal:        General: Normal range of motion.     Cervical back: Normal range of motion.  Skin:    General: Skin is warm.  Neurological:     General: No focal deficit present.     Mental Status: He is alert and oriented to person, place, and time.  Psychiatric:        Behavior: Behavior normal.        Judgment: Judgment normal.     LABORATORY DATA:  I have reviewed the data as listed Lab Results  Component Value Date   WBC 5.1 04/29/2022   HGB 13.7 04/29/2022   HCT 39.0 04/29/2022   MCV 103.7 (H) 04/29/2022   PLT 124 (L) 04/29/2022   Recent Labs    04/29/22 1205  NA 134*  K 4.4  CL 97*  CO2 26  GLUCOSE 101*  BUN 9  CREATININE 0.92  CALCIUM 9.2  GFRNONAA >60  PROT 8.1  ALBUMIN 4.5  AST 120*  ALT 58*  ALKPHOS 113  BILITOT 0.9     US Abdomen Complete  Result Date: 05/16/2022 CLINICAL DATA:  cirrhosis/splenomegaly EXAM: ABDOMEN ULTRASOUND COMPLETE COMPARISON:  None Available. FINDINGS: Gallbladder: There is a shadowing gallstone. No wall thickening or pericholecystic fluid. No sonographic Murphy sign noted by sonographer. Common bile duct: Diameter: 0.4cm. Liver: No focal lesion identified. Hyperechoic consistent with fatty infiltration. Hepatopetal portal vein. IVC: No abnormality visualized. Pancreas: Visualized portion unremarkable. Spleen: Size  and appearance within normal limits. Right Kidney: Length: 10.8cm. Echogenicity within normal limits. No mass or hydronephrosis visualized. Left Kidney: Length: 10.9cm. Echogenicity within normal limits. No mass or hydronephrosis visualized. Abdominal aorta: No aneurysm visualized. IMPRESSION: Hepatic fatty infiltration.  Cholelithiasis. Electronically Signed   By: Layla Maw M.D.   On: 05/16/2022 10:49    Elevated ferritin #ELEVATED FERRITIN: [Iron saturation 17% ferritin 848]-likely secondary to   inflammation/liver disease/metabolic syndrome/alcohol/beer.  April 2024 ultrasound liver- hepatic fatty infiltration.  Cholelithiasis. hereditary hemochromatosis- NEGATIVE;  # Mild thrombocytopenia 120s likely secondary to alcohol/liver disease.  # smoker: Discussed with the patient regarding the ill effects of smoking- including but not limited to cardiac lung and vascular diseases  and malignancies. Counseled against smoking; patient-.  # Alcohol abuse-recommend moderation and then finally coming of alcohol.  Discussed risk of cirrhosis and other malignancies.  # DISPOSITION:  # follow up as needed- Dr.B  All questions were answered. The patient knows to call the clinic with any problems, questions or concerns.    Earna Coder, MD 05/23/2022 9:59 AM

## 2022-06-12 ENCOUNTER — Emergency Department: Payer: Medicaid Other

## 2022-06-12 ENCOUNTER — Emergency Department
Admission: EM | Admit: 2022-06-12 | Discharge: 2022-06-12 | Disposition: A | Discharge status: Home / Self Care | Payer: Medicaid Other | Attending: Emergency Medicine | Admitting: Emergency Medicine

## 2022-06-12 ENCOUNTER — Other Ambulatory Visit: Payer: Self-pay

## 2022-06-12 DIAGNOSIS — Z5321 Procedure and treatment not carried out due to patient leaving prior to being seen by health care provider: Secondary | ICD-10-CM | POA: Diagnosis not present

## 2022-06-12 DIAGNOSIS — R059 Cough, unspecified: Secondary | ICD-10-CM | POA: Insufficient documentation

## 2022-06-12 NOTE — ED Notes (Signed)
Pt came up to security desk, and asked "how much longer is this gonna be till I get seen?" This Clinical research associate stated that there were others in front of him in the line who also needed to be seen, and that we were waiting for open rooms in the main side of the ER.  Pt then proceeded to raise his voice at this Research officer, trade union, Charity fundraiser, stating "y'all need to start doing your jobs, and quit playing around out here."  This Clinical research associate apologized for extensive wait times, and stated that we will get him back into a room as soon as we are able too.  Pt then walked out of the department, ripping off his wrist band, and throwing it on the ground.  Pt noted to have steady gait, and was able to ambulate outside without issues.  Pt in NAD upon walking out.

## 2022-06-12 NOTE — ED Triage Notes (Addendum)
Pt to ED from home for "can't catch my breath". Pt has been out of his inhaler since Friday. Pt has a cough and is productive and is producing green mucus. Pt smells heavily of ETOH. Pt is CAOx4, in no acute distress and ambulatory in triage. Pt is speaking in complete sentences. Pt states "I just need a refill on my inhaler".

## 2023-05-10 ENCOUNTER — Other Ambulatory Visit: Payer: Self-pay | Admitting: Nurse Practitioner

## 2023-05-10 DIAGNOSIS — R9389 Abnormal findings on diagnostic imaging of other specified body structures: Secondary | ICD-10-CM

## 2023-05-12 ENCOUNTER — Ambulatory Visit

## 2023-11-14 ENCOUNTER — Other Ambulatory Visit: Payer: Self-pay | Admitting: Nurse Practitioner

## 2023-11-14 DIAGNOSIS — R7989 Other specified abnormal findings of blood chemistry: Secondary | ICD-10-CM

## 2023-11-17 ENCOUNTER — Ambulatory Visit
Admission: RE | Admit: 2023-11-17 | Discharge: 2023-11-17 | Disposition: A | Discharge status: Home / Self Care | Source: Ambulatory Visit | Attending: Nurse Practitioner | Admitting: Nurse Practitioner

## 2023-11-17 DIAGNOSIS — R7989 Other specified abnormal findings of blood chemistry: Secondary | ICD-10-CM | POA: Diagnosis present

## 2023-11-28 NOTE — Progress Notes (Unsigned)
 11/29/2023 3:12 PM   Tyler Haney June 26, 1960 [id number]  Referring provider: Center, [facility] [address] [place],  [place] [address]  Urological history: 1. Scrotal abscess - Spontaneously drained (2024)   Chief Complaint  Patient presents with   Erectile Dysfunction   HPI: Tyler Haney is a 63 y.o. man who presents today for ED as referred by Grayce Devonshire, DNP, FNP-C.   Previous records reviewed.  SHIM 9  He does not have confidence that he could get and keep an erection, his erections are not firm enough for penetrative intercourse, he has difficulty maintaining his erections,  and he is not finding intercourse satisfactory for him.    Patient is not having spontaneous erections.  He denies any pain or curvature with erections.    PSA (10/2023) 1.1   Testosterone pending    Tried and failed tadalafil and sildenafil, although he says that he has not tried the tadalafil.  He did state that Viagra did not work for him.  PMH: Past Medical History:  Diagnosis Date   Allergy    Diabetes mellitus without complication (HCC)    Emphysema lung (HCC)    Hypertension     Surgical History: Past Surgical History:  Procedure Laterality Date   right knee surgery      Home Medications:  Allergies as of 11/29/2023   No Known Allergies      Medication List        Accurate as of November 29, 2023  3:12 PM. If you have any questions, ask your nurse or doctor.          albuterol 108 (90 Base) MCG/ACT inhaler Commonly known as: VENTOLIN HFA Inhale into the lungs.   aspirin 81 MG chewable tablet Chew by mouth.   atorvastatin 80 MG tablet Commonly known as: LIPITOR Take 80 mg by mouth daily.   budesonide-formoterol 160-4.5 MCG/ACT inhaler Commonly known as: SYMBICORT Inhale into the lungs.   lisinopril 2.5 MG tablet Commonly known as: ZESTRIL Take 2.5 mg by mouth daily.   metFORMIN 1000 MG tablet Commonly known as: GLUCOPHAGE Take  1,000 mg by mouth 2 (two) times daily.   Spiriva HandiHaler 18 MCG Caps Generic drug: Tiotropium Bromide Place 18 mcg into inhaler and inhale.   tadalafil 20 MG tablet Commonly known as: CIALIS Take one to two tablets 30 minutes prior to intercourse What changed:  medication strength how to take this additional instructions Changed by: Mccabe Gloria        Allergies: No Known Allergies  Family History: History reviewed. No pertinent family history.  Social History:  reports that he has been smoking cigarettes. He has never used smokeless tobacco. He reports that he does not currently use alcohol. He reports that he does not use drugs.  ROS: Pertinent ROS in HPI  Physical Exam: BP (!) 142/90   Pulse 83   Ht 6' 2 (1.88 m)   Wt 194 lb (88 kg)   BMI 24.91 kg/m   Constitutional:  Well nourished. Alert and oriented, No acute distress. HEENT: [place] AT, moist mucus membranes.  Trachea midline Cardiovascular: No clubbing, cyanosis, or edema. Respiratory: Normal respiratory effort, no increased work of breathing. Neurologic: Grossly intact, no focal deficits, moving all 4 extremities. Psychiatric: Normal mood and affect.  Laboratory Data: See Epic and HPI   I have reviewed the labs.   Pertinent Imaging: N/A  Assessment & Plan:    1. Erectile dysfunction  I explained that conditions  like diabetes, hypertension, coronary artery disease, peripheral vascular disease, smoking, alcohol consumption, age, sleep apnea and BPH can diminish the ability to have an erection   We will obtain a serum testosterone level at this time; if it is abnormal we will need to repeat the study for confirmation   Explained that moderate to vigorous aerobic exercise for 40 minutes 4 times per week can decrease erectile problems caused by physical inactivity, obesity, hypertension, metabolic syndrome and/or cardiovascular diseases   We discussed trying tadalafil 20 mg, 1 to 2 tablets 30 minutes to  an hour prior to intercourse and then we discussed if that was not effective he would consider ICI, he stated he would like to think about that and we will discuss that upon his return to  Return in about 1 month (around 12/29/2023) for SHIM .  These notes generated with voice recognition software. I apologize for typographical errors.  Tyler Haney  [facility] [address]  [address] [place], [place] [address] [phone]

## 2023-11-29 ENCOUNTER — Ambulatory Visit (INDEPENDENT_AMBULATORY_CARE_PROVIDER_SITE_OTHER): Admitting: Urology

## 2023-11-29 ENCOUNTER — Encounter: Payer: Self-pay | Admitting: Urology

## 2023-11-29 VITALS — BP 142/90 | HR 83 | Ht 74.0 in | Wt 194.0 lb

## 2023-11-29 DIAGNOSIS — N529 Male erectile dysfunction, unspecified: Secondary | ICD-10-CM

## 2023-11-29 MED ORDER — TADALAFIL 20 MG PO TABS: ORAL_TABLET | ORAL | 0 refills | Status: AC

## 2023-11-30 ENCOUNTER — Ambulatory Visit: Payer: Self-pay | Admitting: Urology

## 2023-11-30 LAB — TESTOSTERONE: Testosterone: 475 ng/dL (ref 264–916)

## 2023-12-12 ENCOUNTER — Other Ambulatory Visit (INDEPENDENT_AMBULATORY_CARE_PROVIDER_SITE_OTHER): Payer: Self-pay | Admitting: Nurse Practitioner

## 2023-12-12 DIAGNOSIS — M79606 Pain in leg, unspecified: Secondary | ICD-10-CM

## 2023-12-13 ENCOUNTER — Encounter (INDEPENDENT_AMBULATORY_CARE_PROVIDER_SITE_OTHER): Payer: Self-pay | Admitting: Nurse Practitioner

## 2023-12-13 ENCOUNTER — Ambulatory Visit (INDEPENDENT_AMBULATORY_CARE_PROVIDER_SITE_OTHER): Payer: Self-pay | Admitting: Nurse Practitioner

## 2023-12-13 ENCOUNTER — Ambulatory Visit (INDEPENDENT_AMBULATORY_CARE_PROVIDER_SITE_OTHER): Payer: Self-pay

## 2023-12-13 VITALS — BP 132/88 | HR 97 | Resp 18 | Ht 74.0 in | Wt 196.4 lb

## 2023-12-13 DIAGNOSIS — M79606 Pain in leg, unspecified: Secondary | ICD-10-CM | POA: Diagnosis not present

## 2023-12-13 DIAGNOSIS — I83813 Varicose veins of bilateral lower extremities with pain: Secondary | ICD-10-CM | POA: Diagnosis not present

## 2023-12-17 ENCOUNTER — Encounter (INDEPENDENT_AMBULATORY_CARE_PROVIDER_SITE_OTHER): Payer: Self-pay | Admitting: Nurse Practitioner

## 2023-12-17 NOTE — Progress Notes (Signed)
 SUBJECTIVE:  Patient ID: Tyler Haney, male    DOB: 1960-04-08, 63 y.o.   MRN: [id number] Chief Complaint  Patient presents with   Establish Care    New patient ABI + consult. Bilateral leg pain ref. wood    Discussed the use of AI scribe software for clinical note transcription with the patient, who gave verbal consent to proceed.  History of Present Illness Tyler Haney is a 63 year old male with type 2 diabetes who presents with leg pain. He was referred by Grayce for evaluation of potential arterial issues related to leg pain.  He has been experiencing leg pain for the past four years, primarily located in the left lower extremity, specifically in the back thigh area. The pain occurs when he stands up, described as a stretching sensation, and is alleviated by sitting down and rubbing the area. The pain is not consistently triggered by walking, as he can walk without issue at times.  He has a history of breaking both ankles, which were not treated by a doctor, and now experiences tingling in his feet. He has type 2 diabetes. No numbness is reported, and the sensation is described as tingling rather than burning.  He notes the presence of a purple discoloration on his leg, initially thought to be a bruise. This area does not hurt but feels like it is pulling when he stands and props himself up. He describes a sensation of tightening and pulling, particularly when wearing jeans or sitting on leather surfaces. The pain can radiate down the leg when he moves or stands for extended periods.  No numbness in the feet but reports tingling. He describes the leg pain as a pulling sensation that occurs when standing and is relieved by sitting.     Results Right leg arterial ultrasound: Blood flow 1.18, normal   Left leg arterial ultrasound: Blood flow 1.24, normal   Right foot arterial ultrasound: Blood flow 1.16, normal   Left foot arterial ultrasound: Blood flow 1.21, normal  Past  Medical History:  Diagnosis Date   Allergy    Diabetes mellitus without complication (HCC)    Emphysema lung (HCC)    Hypertension     Past Surgical History:  Procedure Laterality Date   right knee surgery      Social History   Socioeconomic History   Marital status: Single    Spouse name: Not on file   Number of children: Not on file   Years of education: Not on file   Highest education level: Not on file  Occupational History   Not on file  Tobacco Use   Smoking status: Every Day    Types: Cigarettes   Smokeless tobacco: Never  Substance and Sexual Activity   Alcohol use: Not Currently   Drug use: No   Sexual activity: Yes  Other Topics Concern   Not on file  Social History Narrative   Not on file   Social Drivers of Health   Financial Resource Strain: Not on file  Food Insecurity: No Food Insecurity (04/29/2022)   Hunger Vital Sign    Worried About Running Out of Food in the Last Year: Never true    Ran Out of Food in the Last Year: Never true  Transportation Needs: No Transportation Needs (04/29/2022)   PRAPARE - Administrator, Civil Service (Medical): No    Lack of Transportation (Non-Medical): No  Physical Activity: Not on file  Stress: Not on file  Social Connections: Unknown (06/08/2021)   Received from [facility]   Social Network    Social Network: Not on file  Intimate Partner Violence: Not At Risk (04/29/2022)   Humiliation, Afraid, Rape, and Kick questionnaire    Fear of Current or Ex-Partner: No    Emotionally Abused: No    Physically Abused: No    Sexually Abused: No    Family History  Problem Relation Age of Onset   Diabetes Mother     No Known Allergies   Review of Systems   Review of Systems: Negative Unless Checked Constitutional: [] Weight loss  [] Fever  [] Chills Cardiac: [] Chest pain   []  Atrial Fibrillation  [] Palpitations   [] Shortness of breath when laying flat   [] Shortness of breath with exertion. [] Shortness of  breath at rest Vascular:  [] Pain in legs with walking   [] Pain in legs with standing [] Pain in legs when laying flat   [] Claudication    [] Pain in feet when laying flat    [] History of DVT   [] Phlebitis   [] Swelling in legs   [] Varicose veins   [] Non-healing ulcers Pulmonary:   [] Uses home oxygen   [] Productive cough   [] Hemoptysis   [] Wheeze  [] COPD   [] Asthma Neurologic:  [] Dizziness   [] Seizures  [] Blackouts [] History of stroke   [] History of TIA  [] Aphasia   [] Temporary Blindness   [] Weakness or numbness in arm   [] Weakness or numbness in leg Musculoskeletal:   [] Joint swelling   [] Joint pain   [] Low back pain  []  History of Knee Replacement [] Arthritis [] back Surgeries  []  Spinal Stenosis    Hematologic:  [] Easy bruising  [] Easy bleeding   [] Hypercoagulable state   [] Anemic Gastrointestinal:  [] Diarrhea   [] Vomiting  [] Gastroesophageal reflux/heartburn   [] Difficulty swallowing. [] Abdominal pain Genitourinary:  [] Chronic kidney disease   [] Difficult urination  [] Anuric   [] Blood in urine [] Frequent urination  [] Burning with urination   [] Hematuria Skin:  [] Rashes   [] Ulcers [] Wounds Psychological:  [] History of anxiety   []  History of major depression  []  Memory Difficulties      OBJECTIVE:     BP 132/88 (BP Location: Left Arm, Patient Position: Sitting, Cuff Size: Normal)   Pulse 97   Resp 18   Ht 6' 2 (1.88 m)   Wt 196 lb 6.4 oz (89.1 kg)   BMI 25.22 kg/m   Physical Exam Physical Exam EXTREMITIES: Large varicose vein in left posterior thigh.   CMP     Component Value Date/Time   NA 134 (L) 04/29/2022 1205   K 4.4 04/29/2022 1205   CL 97 (L) 04/29/2022 1205   CO2 26 04/29/2022 1205   GLUCOSE 101 (H) 04/29/2022 1205   BUN 9 04/29/2022 1205   CREATININE 0.92 04/29/2022 1205   CALCIUM 9.2 04/29/2022 1205   PROT 8.1 04/29/2022 1205   ALBUMIN 4.5 04/29/2022 1205   AST 120 (H) 04/29/2022 1205   ALT 58 (H) 04/29/2022 1205   ALKPHOS 113 04/29/2022 1205   BILITOT 0.9  04/29/2022 1205   GFRNONAA >60 04/29/2022 1205    No results found.     ASSESSMENT AND PLAN:  1. Varicose veins of both lower extremities with pain (Primary) Left lower extremity varicose veins with associated pain Chronic pain in the left lower extremity, likely due to venous insufficiency from varicose veins. Normal arterial flow. Differential includes muscle strain or vein irritation. - Scheduled venous reflux study to assess for venous insufficiency. - Discussed treatment options: compression stockings,  possible vein ablation if reflux confirmed. - Provided reassurance on normal arterial flow and non-life-threatening condition.     Current Outpatient Medications on File Prior to Visit  Medication Sig Dispense Refill   albuterol (VENTOLIN HFA) 108 (90 Base) MCG/ACT inhaler Inhale into the lungs.     aspirin 81 MG chewable tablet Chew by mouth.     atorvastatin (LIPITOR) 80 MG tablet Take 80 mg by mouth daily.     budesonide-formoterol (SYMBICORT) 160-4.5 MCG/ACT inhaler Inhale into the lungs.     lisinopril (ZESTRIL) 2.5 MG tablet Take 2.5 mg by mouth daily.     metFORMIN (GLUCOPHAGE) 1000 MG tablet Take 1,000 mg by mouth 2 (two) times daily.     tadalafil  (CIALIS ) 20 MG tablet Take one to two tablets 30 minutes prior to intercourse 30 tablet 0   Tiotropium Bromide (SPIRIVA HANDIHALER) 18 MCG CAPS Place 18 mcg into inhaler and inhale.     No current facility-administered medications on file prior to visit.    There are no Patient Instructions on file for this visit. No follow-ups on file.   Carrisa Keller E Kamaile Zachow, NP  This note was completed with Office Manager.  Any errors are purely unintentional.

## 2023-12-26 ENCOUNTER — Other Ambulatory Visit (INDEPENDENT_AMBULATORY_CARE_PROVIDER_SITE_OTHER): Payer: Self-pay | Admitting: Nurse Practitioner

## 2023-12-26 DIAGNOSIS — I83813 Varicose veins of bilateral lower extremities with pain: Secondary | ICD-10-CM

## 2024-01-02 NOTE — Progress Notes (Deleted)
 01/09/2024 11:30 AM   Tyler Haney 1960-08-22 [id number]  Referring provider: [facility] [address] [place],  [place] [address]  Urological history: 1. Scrotal abscess - Spontaneously drained (2024)   No chief complaint on file.  HPI: Tyler Haney is a 63 y.o. man who presents today for 1 month follow-up after trial of tadalafil  20 mg 1 to 2 tablets prior to intercourse.    ED as referred by Grayce Devonshire, DNP, FNP-C.   Previous records reviewed.  Patient was referred by by Grayce Devonshire, DNP, FNP-C for ED.  At his visit on 11/29/2023, SHIM score is 9, indicating severe erectile dysfunction. The patient reports lack of confidence in achieving or maintaining erections, erections insufficient for penetration, difficulty sustaining erections, and unsatisfactory intercourse; he is not experiencing spontaneous erections but denies pain or curvature. PSA from 10/2023 is 1.1; testosterone  level 475.  He has tried and failed sildenafil (Viagra) and reports tadalafil  was prescribed but not attempted.  SHIM ***  He does not have confidence that he could get and keep an erection, his erections are not firm enough for penetrative intercourse, he has difficulty maintaining his erections,  and he is not finding intercourse satisfactory for him.  ***  Patient still having spontaneous erections.  ***   He denies any pain or curvature with erections.    He is not able to ejaculate, has pain with ejaculation, and has blood in his ejaculate fluid.   ***  PMH: Past Medical History:  Diagnosis Date   Allergy    Diabetes mellitus without complication (HCC)    Emphysema lung (HCC)    Hypertension     Surgical History: Past Surgical History:  Procedure Laterality Date   right knee surgery      Home Medications:  Allergies as of 01/09/2024   No Known Allergies      Medication List        Accurate as of January 02, 2024 11:30 AM. If you have any questions,  ask your nurse or doctor.          albuterol 108 (90 Base) MCG/ACT inhaler Commonly known as: VENTOLIN HFA Inhale into the lungs.   aspirin 81 MG chewable tablet Chew by mouth.   atorvastatin 80 MG tablet Commonly known as: LIPITOR Take 80 mg by mouth daily.   budesonide-formoterol 160-4.5 MCG/ACT inhaler Commonly known as: SYMBICORT Inhale into the lungs.   lisinopril 2.5 MG tablet Commonly known as: ZESTRIL Take 2.5 mg by mouth daily.   metFORMIN 1000 MG tablet Commonly known as: GLUCOPHAGE Take 1,000 mg by mouth 2 (two) times daily.   Spiriva HandiHaler 18 MCG Caps Generic drug: Tiotropium Bromide Place 18 mcg into inhaler and inhale.   tadalafil  20 MG tablet Commonly known as: CIALIS  Take one to two tablets 30 minutes prior to intercourse        Allergies: No Known Allergies  Family History: Family History  Problem Relation Age of Onset   Diabetes Mother     Social History:  reports that he has been smoking cigarettes. He has never used smokeless tobacco. He reports that he does not currently use alcohol. He reports that he does not use drugs.  ROS: Pertinent ROS in HPI  Physical Exam: There were no vitals taken for this visit.  Constitutional:  Well nourished. Alert and oriented, No acute distress. HEENT: Marc[place] AT, moist mucus membranes.  Trachea midline, no masses. Cardiovascular: No clubbing, cyanosis, or edema. Respiratory: Normal  respiratory effort, no increased work of breathing. GI: Abdomen is soft, non tender, non distended, no abdominal masses. Liver and spleen not palpable.  No hernias appreciated.  Stool sample for occult testing is not indicated.   GU: No CVA tenderness.  No bladder fullness or masses.  Patient with circumcised/uncircumcised phallus. ***Foreskin easily retracted***  Urethral meatus is patent.  No penile discharge. No penile lesions or rashes. Scrotum without lesions, cysts, rashes and/or edema.  Testicles are located  scrotally bilaterally. No masses are appreciated in the testicles. Left and right epididymis are normal. Rectal: Patient with  normal sphincter tone. Anus and perineum without scarring or rashes. No rectal masses are appreciated. Prostate is approximately *** grams, *** nodules are appreciated. Seminal vesicles are normal. Skin: No rashes, bruises or suspicious lesions. Lymph: No cervical or inguinal adenopathy. Neurologic: Grossly intact, no focal deficits, moving all 4 extremities. Psychiatric: Normal mood and affect.   Laboratory Data: See Epic and HPI   I have reviewed the labs.   Pertinent Imaging: N/A  Assessment & Plan:    1. Erectile dysfunction - Reviewed treatment options beyond oral PDE5 inhibitors: Vacuum erection device (VED), Intracavernosal injection therapy, Intraurethral alprostadil, and Penile prosthesis (surgical option) - Hormonal therapy not indicated given normal testosterone . - Patient education provided regarding risks, benefits, and expectations of advanced therapies. - Patient to consider next-step therapy; follow-up in 4-6 weeks or sooner if decision made.  No follow-ups on file.  These notes generated with voice recognition software. I apologize for typographical errors.  CLOTILDA HELON RIGGERS  [facility] [address]  [address] [place], [place] [address] [phone]

## 2024-01-03 ENCOUNTER — Other Ambulatory Visit (INDEPENDENT_AMBULATORY_CARE_PROVIDER_SITE_OTHER)

## 2024-01-03 ENCOUNTER — Ambulatory Visit (INDEPENDENT_AMBULATORY_CARE_PROVIDER_SITE_OTHER): Admitting: Nurse Practitioner

## 2024-01-03 ENCOUNTER — Encounter (INDEPENDENT_AMBULATORY_CARE_PROVIDER_SITE_OTHER): Payer: Self-pay | Admitting: Nurse Practitioner

## 2024-01-03 VITALS — BP 131/85 | HR 80 | Resp 17 | Ht 74.0 in | Wt 196.0 lb

## 2024-01-03 DIAGNOSIS — I83813 Varicose veins of bilateral lower extremities with pain: Secondary | ICD-10-CM

## 2024-01-08 ENCOUNTER — Encounter (INDEPENDENT_AMBULATORY_CARE_PROVIDER_SITE_OTHER): Payer: Self-pay | Admitting: Nurse Practitioner

## 2024-01-08 NOTE — Progress Notes (Signed)
 Subjective:    Patient ID: Tyler Haney, male    DOB: 1960/12/16, 63 y.o.   MRN: [id number] Chief Complaint  Patient presents with   Follow-up    pt conv + L [place] Reflux    HPI  Discussed the use of AI scribe software for clinical note transcription with the patient, who gave verbal consent to proceed.  History of Present Illness Tyler Haney is a 63 year old male who presents with discomfort from a prominent vein on the back of his thigh.  He experiences discomfort from a prominent vein on the back of his thigh, particularly when wearing jeans that rub against the area. The presence of hair exacerbates the irritation. The discomfort is not constant.  The discomfort sometimes increases when he is on his feet for extended periods, which he attributes to his previous occupation that required prolonged standing. He finds relief when wearing looser clothing, such as shorts.  He has a family history of varicose veins, as his grandmother had visible, bluish veins. He associates his symptoms with his history of standing for long periods on hard surfaces, such as concrete floors, during his work. No current pain.    Results No evidence of DVT or superficial thrombophlebitis bilaterally.  No evidence of venous insufficiency or venous reflux noted bilaterally.    Review of Systems  All other systems reviewed and are negative.      Objective:   Physical Exam Vitals reviewed.  HENT:     Head: Normocephalic.  Cardiovascular:     Rate and Rhythm: Normal rate.     Pulses: Normal pulses.  Pulmonary:     Effort: Pulmonary effort is normal.  Musculoskeletal:        General: Tenderness present.  Skin:    General: Skin is warm and dry.  Neurological:     Mental Status: He is alert and oriented to person, place, and time.  Psychiatric:        Mood and Affect: Mood normal.        Behavior: Behavior normal.        Thought Content: Thought content normal.        Judgment:  Judgment normal.     Physical Exam    BP 131/85   Pulse 80   Resp 17   Ht 6' 2 (1.88 m)   Wt 196 lb (88.9 kg)   BMI 25.16 kg/m   Past Medical History:  Diagnosis Date   Allergy    Diabetes mellitus without complication (HCC)    Emphysema lung (HCC)    Hypertension     Social History   Socioeconomic History   Marital status: Single    Spouse name: Not on file   Number of children: Not on file   Years of education: Not on file   Highest education level: Not on file  Occupational History   Not on file  Tobacco Use   Smoking status: Every Day    Types: Cigarettes   Smokeless tobacco: Never  Substance and Sexual Activity   Alcohol use: Not Currently   Drug use: No   Sexual activity: Yes  Other Topics Concern   Not on file  Social History Narrative   Not on file   Social Drivers of Health   Tobacco Use: High Risk (01/03/2024)   Patient History    Smoking Tobacco Use: Every Day    Smokeless Tobacco Use: Never    Passive Exposure: Not on file  Financial  Resource Strain: Not on file  Food Insecurity: No Food Insecurity (04/29/2022)   Hunger Vital Sign    Worried About Running Out of Food in the Last Year: Never true    Ran Out of Food in the Last Year: Never true  Transportation Needs: No Transportation Needs (04/29/2022)   PRAPARE - Administrator, Civil Service (Medical): No    Lack of Transportation (Non-Medical): No  Physical Activity: Not on file  Stress: Not on file  Social Connections: Unknown (06/08/2021)   Received from [facility]   Social Network    Social Network: Not on file  Intimate Partner Violence: Not At Risk (04/29/2022)   Humiliation, Afraid, Rape, and Kick questionnaire    Fear of Current or Ex-Partner: No    Emotionally Abused: No    Physically Abused: No    Sexually Abused: No  Depression (PHQ2-9): Low Risk (04/29/2022)   Depression (PHQ2-9)    PHQ-2 Score: 0  Alcohol Screen: Not on file  Housing: Low Risk (04/29/2022)    Housing    Last Housing Risk Score: 0  Utilities: Not At Risk (04/29/2022)   [facility]HC Utilities    Threatened with loss of utilities: No  Health Literacy: Not on file    Past Surgical History:  Procedure Laterality Date   right knee surgery      Family History  Problem Relation Age of Onset   Diabetes Mother     Allergies[1]     Latest Ref Rng & Units 04/29/2022   12:05 PM  CBC  WBC 4.0 - 10.5 K/uL 5.1   Hemoglobin 13.0 - 17.0 g/dL 86.2   Hematocrit 60.9 - 52.0 % 39.0   Platelets 150 - 400 K/uL 124       CMP     Component Value Date/Time   NA 134 (L) 04/29/2022 1205   K 4.4 04/29/2022 1205   CL 97 (L) 04/29/2022 1205   CO2 26 04/29/2022 1205   GLUCOSE 101 (H) 04/29/2022 1205   BUN 9 04/29/2022 1205   CREATININE 0.92 04/29/2022 1205   CALCIUM 9.2 04/29/2022 1205   PROT 8.1 04/29/2022 1205   ALBUMIN 4.5 04/29/2022 1205   AST 120 (H) 04/29/2022 1205   ALT 58 (H) 04/29/2022 1205   ALKPHOS 113 04/29/2022 1205   BILITOT 0.9 04/29/2022 1205   GFRNONAA >60 04/29/2022 1205     VAS US  ABI WITH/WO TBI Result Date: 12/18/2023  LOWER EXTREMITY DOPPLER STUDY Patient Name:  Tyler Haney  Date of Exam:   12/13/2023 Medical Rec #: [id number]       Accession #:    [id number] Date of Birth: 01-15-1961       Patient Gender: M Patient Age:   66 years Exam Location:  [facility] Procedure:      VAS US  ABI WITH/WO TBI Referring Phys: ORVIN DARING --------------------------------------------------------------------------------  Indications: Rest pain.  Performing Technologist: Jerel Croak RVT  Examination Guidelines: A complete evaluation includes at minimum, Doppler waveform signals and systolic blood pressure reading at the level of bilateral brachial, anterior tibial, and posterior tibial arteries, when vessel segments are accessible. Bilateral testing is considered an integral part of a complete examination. Photoelectric Plethysmograph (PPG) waveforms and toe systolic  pressure readings are included as required and additional duplex testing as needed. Limited examinations for reoccurring indications may be performed as noted.  ABI Findings: +---------+------------------+-----+---------+--------+ Right    Rt Pressure (mmHg)IndexWaveform Comment  +---------+------------------+-----+---------+--------+ Brachial 129                                      +---------+------------------+-----+---------+--------+  PTA      154               1.18 triphasic         +---------+------------------+-----+---------+--------+ DP       152               1.17 triphasic         +---------+------------------+-----+---------+--------+ Great Toe151               1.16 Normal            +---------+------------------+-----+---------+--------+ +---------+------------------+-----+---------+-------+ Left     Lt Pressure (mmHg)IndexWaveform Comment +---------+------------------+-----+---------+-------+ Brachial 130                                     +---------+------------------+-----+---------+-------+ PTA      161               1.24 triphasic        +---------+------------------+-----+---------+-------+ DP       144               1.11 triphasic        +---------+------------------+-----+---------+-------+ Great Toe157               1.21 Normal           +---------+------------------+-----+---------+-------+  Summary: Right: Resting right ankle-brachial index is within normal range. The right toe-brachial index is normal.  Left: Resting left ankle-brachial index is within normal range. The left toe-brachial index is normal.  *See table(s) above for measurements and observations.  Electronically signed by Selinda Gu MD on 12/18/2023 at 10:08:25 AM.    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 No clots or valvular issues. Prefers to avoid sclerotherapy currently. - Advised wearing compression shorts if veins become bothersome. - Instructed to contact clinic if veins become painful or bothersome for further evaluation and potential sclerotherapy.     Medications Ordered Prior to Encounter[2]  There are no Patient Instructions on file for this visit. No follow-ups on file.   Ramonte Mena E Jilliane Kazanjian, NP      [1] No Known Allergies [2]  Current Outpatient Medications on File Prior to Visit  Medication Sig Dispense Refill   albuterol (VENTOLIN HFA) 108 (90 Base) MCG/ACT inhaler Inhale into the lungs.     atorvastatin (LIPITOR) 80 MG tablet Take 80 mg by mouth daily.     budesonide-formoterol (SYMBICORT) 160-4.5 MCG/ACT inhaler Inhale into the lungs.     lisinopril (ZESTRIL) 2.5 MG tablet Take 2.5 mg by mouth daily.     metFORMIN (GLUCOPHAGE) 1000 MG tablet Take 1,000 mg by mouth 2 (two) times daily.     tadalafil  (CIALIS ) 20 MG tablet Take one to two tablets 30 minutes prior to intercourse 30 tablet 0   Tiotropium Bromide (SPIRIVA HANDIHALER) 18 MCG CAPS Place 18 mcg into inhaler and inhale.     aspirin 81 MG chewable tablet Chew by mouth. (Patient not taking: Reported on 01/03/2024)     No current facility-administered medications on file prior to visit.

## 2024-01-09 ENCOUNTER — Ambulatory Visit: Admitting: Urology

## 2024-01-09 DIAGNOSIS — N529 Male erectile dysfunction, unspecified: Secondary | ICD-10-CM

## 2024-01-11 ENCOUNTER — Encounter: Payer: Self-pay | Admitting: Urology
